# Patient Record
Sex: Female | Born: 1955 | Race: Black or African American | Hispanic: No | Smoking: Never smoker
Health system: Southern US, Community
[De-identification: ages and names within clinical notes are randomized; demographics above are authoritative.]

## PROBLEM LIST (undated history)

## (undated) DIAGNOSIS — IMO0001 Reserved for inherently not codable concepts without codable children: Secondary | ICD-10-CM

## (undated) DIAGNOSIS — R011 Cardiac murmur, unspecified: Secondary | ICD-10-CM

## (undated) DIAGNOSIS — J329 Chronic sinusitis, unspecified: Secondary | ICD-10-CM

## (undated) HISTORY — DX: Chronic sinusitis, unspecified: J32.9

## (undated) HISTORY — DX: Reserved for inherently not codable concepts without codable children: IMO0001

## (undated) HISTORY — DX: Cardiac murmur, unspecified: R01.1

## (undated) HISTORY — PX: TUBAL LIGATION: SHX77

---

## 2010-07-12 ENCOUNTER — Other Ambulatory Visit: Payer: Self-pay | Admitting: Obstetrics & Gynecology

## 2010-07-12 DIAGNOSIS — R1900 Intra-abdominal and pelvic swelling, mass and lump, unspecified site: Secondary | ICD-10-CM

## 2010-07-13 ENCOUNTER — Other Ambulatory Visit: Payer: Self-pay | Admitting: Obstetrics & Gynecology

## 2010-07-13 DIAGNOSIS — R1900 Intra-abdominal and pelvic swelling, mass and lump, unspecified site: Secondary | ICD-10-CM

## 2010-07-13 DIAGNOSIS — Z1231 Encounter for screening mammogram for malignant neoplasm of breast: Secondary | ICD-10-CM

## 2010-07-19 ENCOUNTER — Other Ambulatory Visit (HOSPITAL_COMMUNITY): Payer: Self-pay

## 2010-07-26 ENCOUNTER — Ambulatory Visit (HOSPITAL_COMMUNITY): Admission: RE | Admit: 2010-07-26 | Payer: BC Managed Care – PPO | Source: Ambulatory Visit

## 2010-07-26 ENCOUNTER — Ambulatory Visit (HOSPITAL_COMMUNITY): Payer: BC Managed Care – PPO

## 2010-12-28 ENCOUNTER — Ambulatory Visit: Payer: BC Managed Care – PPO | Admitting: Family Medicine

## 2011-01-20 ENCOUNTER — Ambulatory Visit (INDEPENDENT_AMBULATORY_CARE_PROVIDER_SITE_OTHER): Payer: BC Managed Care – PPO | Admitting: Family Medicine

## 2011-01-20 ENCOUNTER — Encounter: Payer: Self-pay | Admitting: Family Medicine

## 2011-01-20 VITALS — BP 139/78 | HR 97 | Temp 98.1°F | Ht 65.0 in | Wt 176.1 lb

## 2011-01-20 DIAGNOSIS — D259 Leiomyoma of uterus, unspecified: Secondary | ICD-10-CM

## 2011-01-20 DIAGNOSIS — Z0000 Encounter for general adult medical examination without abnormal findings: Secondary | ICD-10-CM

## 2011-01-20 DIAGNOSIS — Z1239 Encounter for other screening for malignant neoplasm of breast: Secondary | ICD-10-CM

## 2011-01-20 DIAGNOSIS — IMO0001 Reserved for inherently not codable concepts without codable children: Secondary | ICD-10-CM

## 2011-01-20 NOTE — Patient Instructions (Signed)
It was nice to meet you today!!  Please call and schedule your mammogram.   Please come back to see me in 6 months for a follow-up. At that time, we will do blood work and a Tetanus shot.  Thank you! Take care! Amber M. Hairford, M.D.

## 2011-01-23 ENCOUNTER — Encounter: Payer: Self-pay | Admitting: Family Medicine

## 2011-01-23 DIAGNOSIS — Z0000 Encounter for general adult medical examination without abnormal findings: Secondary | ICD-10-CM | POA: Insufficient documentation

## 2011-01-23 DIAGNOSIS — IMO0001 Reserved for inherently not codable concepts without codable children: Secondary | ICD-10-CM | POA: Insufficient documentation

## 2011-01-24 NOTE — Progress Notes (Signed)
  Subjective:    Patient ID: Jaclyn Barrera, female    DOB: November 26, 1955, 55 y.o.   MRN: [id number]  HPI  Pt is a new patient, coming to the office to establish care with a PCP after being seen at [facility] in June for vaginal bleeding. Pt has no concerns today.   She was seen at [facility] and diagnosed with bleeding uterine fibroids. She started taking Alfalfa and Vitamin A for this, which she states has helped. We do not have these records to see which labs and studies she had done, so we will request these records. Pt still has heavy menstrual bleeding and cramping that she states has been heavy for a few years. LMP on 01/14/2011  Otherwise, she has no past medical history other than frequent sinus problems which she treats with OTC supplements and green tea.  Unsure of pt's last pap smear. Never had mammogram or colonoscopy. Does not take Flu Shot. Last Tetanus shot was in the [employer], but unsure when. She goes to dentist and eye doctor.    Review of Systems  Constitutional: Negative for fever, chills and activity change.  HENT: Negative for congestion and rhinorrhea.   Eyes: Negative for visual disturbance.  Respiratory: Negative for cough and shortness of breath.   Cardiovascular: Negative for chest pain and leg swelling.  Gastrointestinal: Negative for nausea, vomiting, diarrhea and constipation.  Genitourinary: Negative for difficulty urinating.  Musculoskeletal: Negative for myalgias and arthralgias.  Skin: Positive for rash.  Neurological: Negative for headaches.  Hematological: Negative for adenopathy.       Objective:   Physical Exam  Constitutional: She is oriented to person, place, and time. She appears well-developed and well-nourished. No distress.  HENT:  Head: Normocephalic and atraumatic.  Right Ear: External ear normal.  Left Ear: External ear normal.  Eyes: Conjunctivae are normal. Pupils are equal, round, and reactive to light.  Neck: Normal range of  motion. No tracheal deviation present. No thyromegaly present.  Cardiovascular: Normal rate, regular rhythm and normal heart sounds.   Pulmonary/Chest: Effort normal and breath sounds normal.  Abdominal: Soft. Bowel sounds are normal. She exhibits no distension. There is no tenderness.  Musculoskeletal: Normal range of motion. She exhibits edema (Trace).  Neurological: She is alert and oriented to person, place, and time. No cranial nerve deficit.  Skin: Skin is warm and dry. Rash (Hypopigmented on forearms.) noted.  Psychiatric: She has a normal mood and affect. Her behavior is normal.          Assessment & Plan:

## 2011-01-24 NOTE — Assessment & Plan Note (Signed)
Patient has heavy periods and was seen at [facility], dx with bleeding uterine fibroids. Will get those records and see patient back in 6 months. At that time, we will do a pelvic exam to evaluate for masses, etc. Pt aware of this plan.

## 2011-01-24 NOTE — Assessment & Plan Note (Signed)
Patient has never had mammogram or colonoscopy. She has a mammo previously ordered in our system. She was given the number to call and schedule her mammogram soon. Since she was a new patient and I did not want to overwhelm her with tests/recommendations, I did mention a colonoscopy but did not order it today. She denied the flu shot. She also needs a tetanus shot, but she does not want to get that today since she was not prepared for it. She states she will get it at her next visit in 6 months. Did not order labs today since she was recently seen at [facility]. We will request those records and see what she needs, then order labs at next visit, if necessary.

## 2011-10-13 ENCOUNTER — Ambulatory Visit (INDEPENDENT_AMBULATORY_CARE_PROVIDER_SITE_OTHER): Payer: BC Managed Care – PPO | Admitting: Family Medicine

## 2011-10-13 ENCOUNTER — Encounter: Payer: Self-pay | Admitting: Family Medicine

## 2011-10-13 VITALS — BP 120/79 | HR 76 | Temp 98.0°F | Ht 65.0 in | Wt 178.0 lb

## 2011-10-13 DIAGNOSIS — Z0000 Encounter for general adult medical examination without abnormal findings: Secondary | ICD-10-CM

## 2011-10-13 DIAGNOSIS — D259 Leiomyoma of uterus, unspecified: Secondary | ICD-10-CM

## 2011-10-13 DIAGNOSIS — Z23 Encounter for immunization: Secondary | ICD-10-CM

## 2011-10-13 DIAGNOSIS — IMO0001 Reserved for inherently not codable concepts without codable children: Secondary | ICD-10-CM

## 2011-10-13 NOTE — Patient Instructions (Signed)
It was great to see you today! I am so glad everything is going well. If you need me to fill out any forms for your foster care class, please let me know.  Come back to see me in about 6 months, or sooner if you need anything!  Alysson Geist M. Khalea Ventura, M.D.

## 2011-10-13 NOTE — Progress Notes (Signed)
Subjective:     Patient ID: Jaclyn Barrera, female   DOB: 1956/03/24, 56 y.o.   MRN: [id number]  HPI Patient is a 56 yo F here for a follow up appointment.  1. Menorrhagia- Much improved. Patient states she feels like she is going through menopause. Her bleeding symptoms we had discussed at her last visit have improved. She has bleeding some but not to the extent she did a few months ago. No pain related to periods. No excessive bleeding or passing clots. No concerns about this today.  2. Health Maintenance- Needs tetanus shot today. Also needs mammogram. Patient had pap last year at [facility], which she states was wnl although those records were never sent. Patient very healthy overall. No chronic medications. No complaints. She had some intermittent dizziness last week related to her sinuses, but this has improved. No headaches, congestion, sore throat or current dizziness. Patient also states she may need a form completed for her foster care class, which I will be happy to complete when she needs it.  History reviewed: Non-smoker.    Review of Systems See HPI above    Objective:   Physical Exam  Constitutional: She appears well-developed and well-nourished. No distress.  HENT:  Head: Normocephalic and atraumatic.  Cardiovascular: Normal rate, regular rhythm and normal heart sounds.   No murmur heard. Pulmonary/Chest: Effort normal and breath sounds normal.  Abdominal: Soft. She exhibits no mass. There is no tenderness.  Musculoskeletal: Normal range of motion. She exhibits no edema.   Assessment:     56 yo healthy female follow-up appointment    Plan:

## 2011-10-13 NOTE — Assessment & Plan Note (Signed)
Bleeding has improved. Patient has no complaints at this time. If she continues to have bleeding, will consider endometrial biopsy and/or referral to gyn. For now, we will continue to monitor. If she has large bleeding episodes again, can give Provera another shot and will also likely need to check CBC.

## 2011-10-13 NOTE — Assessment & Plan Note (Signed)
Patient received Tdap today. Mammogram was ordered and she will schedule it. If she needs paperwork for her foster parent class, she should bring it to the office and I will be happy to complete it.

## 2011-10-27 ENCOUNTER — Encounter: Payer: Self-pay | Admitting: Family Medicine

## 2011-10-27 ENCOUNTER — Ambulatory Visit
Admission: RE | Admit: 2011-10-27 | Discharge: 2011-10-27 | Disposition: A | Discharge status: Home / Self Care | Payer: BC Managed Care – PPO | Source: Ambulatory Visit | Attending: Family Medicine | Admitting: Family Medicine

## 2011-10-27 DIAGNOSIS — Z0000 Encounter for general adult medical examination without abnormal findings: Secondary | ICD-10-CM

## 2011-11-01 ENCOUNTER — Telehealth: Payer: Self-pay | Admitting: Family Medicine

## 2011-11-01 NOTE — Telephone Encounter (Signed)
Patient dropped off form to be filled out for social services.  Please call her when it is ready to be picked up.

## 2011-11-02 NOTE — Telephone Encounter (Signed)
Medical Evaluation Form from [place] [employer] placed in Dr. Algis Downs box for completion.  Jaclyn Barrera

## 2011-11-09 NOTE — Telephone Encounter (Signed)
Message left on voicemail to call regarding form. Need to schedule PPD.

## 2011-11-09 NOTE — Telephone Encounter (Signed)
Form completed. Asks for date of last PPD, and we do not have one recorded in our clinic notes. Could you please let patient know the form has been signed, but she will need to have a TB test before it is "completed"  Thank you! Jurline Folger M. Sameka Bagent, M.D.

## 2011-11-10 NOTE — Telephone Encounter (Signed)
Appointment scheduled for PPD placement 08/05.

## 2011-11-13 ENCOUNTER — Ambulatory Visit (INDEPENDENT_AMBULATORY_CARE_PROVIDER_SITE_OTHER): Payer: BC Managed Care – PPO | Admitting: *Deleted

## 2011-11-13 DIAGNOSIS — Z111 Encounter for screening for respiratory tuberculosis: Secondary | ICD-10-CM

## 2011-11-15 ENCOUNTER — Ambulatory Visit (INDEPENDENT_AMBULATORY_CARE_PROVIDER_SITE_OTHER): Payer: BC Managed Care – PPO | Admitting: *Deleted

## 2011-11-15 DIAGNOSIS — Z111 Encounter for screening for respiratory tuberculosis: Secondary | ICD-10-CM

## 2011-11-15 DIAGNOSIS — IMO0001 Reserved for inherently not codable concepts without codable children: Secondary | ICD-10-CM

## 2011-11-15 NOTE — Progress Notes (Signed)
Physical form give to patient.

## 2013-02-25 ENCOUNTER — Encounter: Payer: Self-pay | Admitting: Family Medicine

## 2013-02-25 ENCOUNTER — Other Ambulatory Visit (HOSPITAL_COMMUNITY)
Admission: RE | Admit: 2013-02-25 | Discharge: 2013-02-25 | Disposition: A | Discharge status: Home / Self Care | Payer: BC Managed Care – PPO | Source: Ambulatory Visit | Attending: Family Medicine | Admitting: Family Medicine

## 2013-02-25 ENCOUNTER — Ambulatory Visit (INDEPENDENT_AMBULATORY_CARE_PROVIDER_SITE_OTHER): Payer: BC Managed Care – PPO | Admitting: Family Medicine

## 2013-02-25 VITALS — BP 154/92 | HR 80 | Temp 98.5°F | Ht 65.0 in | Wt 182.0 lb

## 2013-02-25 DIAGNOSIS — Z124 Encounter for screening for malignant neoplasm of cervix: Secondary | ICD-10-CM

## 2013-02-25 DIAGNOSIS — Z01419 Encounter for gynecological examination (general) (routine) without abnormal findings: Secondary | ICD-10-CM | POA: Insufficient documentation

## 2013-02-25 DIAGNOSIS — Z1151 Encounter for screening for human papillomavirus (HPV): Secondary | ICD-10-CM | POA: Insufficient documentation

## 2013-02-25 DIAGNOSIS — N898 Other specified noninflammatory disorders of vagina: Secondary | ICD-10-CM

## 2013-02-25 DIAGNOSIS — Z0000 Encounter for general adult medical examination without abnormal findings: Secondary | ICD-10-CM

## 2013-02-25 DIAGNOSIS — N760 Acute vaginitis: Secondary | ICD-10-CM

## 2013-02-25 LAB — POCT WET PREP (WET MOUNT): Clue Cells Wet Prep Whiff POC: NEGATIVE

## 2013-02-25 MED ORDER — METRONIDAZOLE 500 MG PO TABS: 500.0000 mg | ORAL_TABLET | Freq: Two times a day (BID) | ORAL | Status: DC

## 2013-02-25 NOTE — Patient Instructions (Signed)
Bacterial Vaginosis Bacterial vaginosis is an infection of the vagina. A healthy vagina has many kinds of good germs (bacteria). Sometimes the number of good germs can change. This allows bad germs to move in and cause an infection. You may be given medicine (antibiotics) to treat the infection. Or, you may not need treatment at all. HOME CARE  Take your medicine as told. Finish them even if you start to feel better.  Do not have sex until you finish your medicine.  Do not douche.  Practice safe sex.  Tell your sex partner that you have an infection. They should see their doctor for treatment if they have problems. GET HELP RIGHT AWAY IF:  You do not get better after 3 days of treatment.  You have grey fluid (discharge) coming from your vagina.  You have pain.  You have a temperature of 102 F (38.9 C) or higher. MAKE SURE YOU:   Understand these instructions.  Will watch your condition.  Will get help right away if you are not doing well or get worse. Document Released: 01/04/2008 Document Revised: 06/19/2011 Document Reviewed: 11/06/2012 [employer] Patient Information 2014 [employer].  

## 2013-02-25 NOTE — Progress Notes (Signed)
Patient ID: Jaclyn Barrera, female   DOB: 1956-03-12, 57 y.o.   MRN: [id number]  [facility] Jaclyn Barrera M. Jaclyn Kirkey, MD Phone: [phone]   Subjective: HPI: Patient is a 57 y.o. female presenting to clinic today for annual exam. Concerns today include vaginal discharge  Vaginitis Patient presents for evaluation of an abnormal vaginal discharge. Symptoms have been present for several months. Vaginal symptoms: discharge described as clear and odorless. Contraception: none. She denies bumps, local irritation and odor. Sexually transmitted infection risk: very low risk of STD exposure. Menstrual flow: currently not having menstrual period; perimenopausal.  Health maintenance Last pap smear - unsure, will collect today. Last mammogram >1 year. Colonoscopy - Never had Flu shot - Declined No signs of depression  History Reviewed: Never smoker.  ROS: Please see HPI above.  Objective: Office vital signs reviewed. BP 154/92  Pulse 80  Temp(Src) 98.5 F (36.9 C) (Oral)  Ht 5\' 5"  (1.651 m)  Wt 182 lb (82.555 kg)  BMI 30.29 kg/m2  LMP 12/09/2012  Physical Examination:  General: Awake, alert. NAD HEENT: Atraumatic, normocephalic Neck: No masses palpated. No LAD Pulm: CTAB, no wheezes Cardio: RRR, no murmurs appreciated Abdomen:+BS, soft, nontender, nondistended GU: Difficult to fully examine cervix due to posterior position. However, os visualized and pap collected. Wet prep collected. Extremities: No edema Neuro: Grossly intact  Assessment: 57 y.o. female annual exam  Plan: See Problem List and After Visit Summary

## 2013-02-26 DIAGNOSIS — N760 Acute vaginitis: Secondary | ICD-10-CM | POA: Insufficient documentation

## 2013-02-26 NOTE — Assessment & Plan Note (Signed)
Pap collected. Given number for mammogram and colonoscopy. No labs today since not on medication, but consider at next visit.

## 2013-02-26 NOTE — Assessment & Plan Note (Signed)
Wet prep collected, showed clue cells which was not expected. It appears to be physiologic discharge, but given her concern and clues on microscopy, will treat at BV with Flagyl x7 days. Patient agrees. F/u as needed.

## 2013-02-28 ENCOUNTER — Encounter: Payer: Self-pay | Admitting: Family Medicine

## 2014-02-09 ENCOUNTER — Encounter: Payer: Self-pay | Admitting: Family Medicine

## 2014-03-26 ENCOUNTER — Telehealth: Payer: Self-pay | Admitting: Obstetrics and Gynecology

## 2014-03-26 ENCOUNTER — Ambulatory Visit (INDEPENDENT_AMBULATORY_CARE_PROVIDER_SITE_OTHER): Payer: BC Managed Care – PPO | Admitting: Obstetrics and Gynecology

## 2014-03-26 ENCOUNTER — Encounter: Payer: Self-pay | Admitting: Obstetrics and Gynecology

## 2014-03-26 VITALS — BP 124/78 | HR 82 | Temp 98.0°F | Ht 65.0 in | Wt 178.0 lb

## 2014-03-26 DIAGNOSIS — D259 Leiomyoma of uterus, unspecified: Secondary | ICD-10-CM | POA: Diagnosis not present

## 2014-03-26 LAB — CBC
HCT: 39.3 % (ref 36.0–46.0)
HEMOGLOBIN: 13.4 g/dL (ref 12.0–15.0)
MCH: 28.2 pg (ref 26.0–34.0)
MCHC: 34.1 g/dL (ref 30.0–36.0)
MCV: 82.7 fL (ref 78.0–100.0)
MPV: 9.6 fL (ref 9.4–12.4)
Platelets: 324 10*3/uL (ref 150–400)
RBC: 4.75 MIL/uL (ref 3.87–5.11)
RDW: 13.7 % (ref 11.5–15.5)
WBC: 4.3 10*3/uL (ref 4.0–10.5)

## 2014-03-26 NOTE — Progress Notes (Signed)
     Subjective: Chief Complaint  Patient presents with  . Annual Exam    HPI: Jaclyn Barrera is a 58 y.o. presenting to clinic today for her annual exam. She has no concerns today. I discussed with her if she was still having menstraul periods. Patient states that she is still having menstrual bleeding. She says that they are irregular. Says it is more like spotting. She has never fully stopped having periods for greater than 1 year. No diagnosis of menopause in her history. However patient does have history of large fibroid.  Health Maintenance: Colonoscopy and mammogram. Flu vaccine.  Nonsmoker  All systems were reviewed and were negative unless otherwise noted in the HPI Past Medical, Surgical, Social, and Family History Reviewed & Updated per EMR.   Objective: BP 124/78 mmHg  Pulse 82  Temp(Src) 98 F (36.7 C) (Oral)  Ht 5\' 5"  (1.651 m)  Wt 80.74 kg (178 lb)  BMI 29.62 kg/m2  Physical Exam  Constitutional: She is oriented to person, place, and time and well-developed, well-nourished, and in no distress.  Eyes: Conjunctivae and EOM are normal. Pupils are equal, round, and reactive to light. No scleral icterus.  Neck: Normal range of motion. Neck supple.  Cardiovascular: Normal rate and regular rhythm.   Murmur heard. Pulmonary/Chest: Effort normal and breath sounds normal.  Abdominal: Soft. Bowel sounds are normal.  Large fibroid measuring about 18cm in height, firm.  Musculoskeletal: Normal range of motion. She exhibits no edema or tenderness.  Neurological: She is alert and oriented to person, place, and time. She has normal reflexes. Gait normal.  Skin: Skin is warm and dry. No rash noted.    Assessment/Plan: Please see problem based Assessment and Plan  Health Maintainance: Patient given numbers to schedule colonoscopy and mammography. Patient declined flu vaccine.  Luiz Blare, DO 03/26/2014, 1:48 PM PGY-1, [place]

## 2014-03-26 NOTE — Patient Instructions (Addendum)
Ms. Jaclyn Barrera it was great to see you today!  I am pleased to hear that things are going well for you.  Here are some of the things we discussed today: -Please schedule your colonoscopy and mammogram. These are important screening test for your health.  -We are ordering an ultrasound to look at your fibroid. I will follow-up with the results. -We need to do an endometrial biopsy as well.    Please schedule a follow-up appointment for procedure clinic to have an endometrial biopsy.   Thanks for allowing me to be a part of your care! Dr. Gerarda Fraction   Fibroids Fibroids are lumps (tumors) that can occur any place in a woman's body. These lumps are not cancerous. Fibroids vary in size, weight, and where they grow. HOME CARE  Do not take aspirin.  Write down the number of pads or tampons you use during your period. Tell your doctor. This can help determine the best treatment for you. GET HELP RIGHT AWAY IF:  You have pain in your lower belly (abdomen) that is not helped with medicine.  You have cramps that are not helped with medicine.  You have more bleeding between or during your period.  You feel lightheaded or pass out (faint).  Your lower belly pain gets worse. MAKE SURE YOU:  Understand these instructions.  Will watch your condition.  Will get help right away if you are not doing well or get worse. Document Released: 04/29/2010 Document Revised: 06/19/2011 Document Reviewed: 04/29/2010 [facility] Patient Information 2015 [place], [place]. This information is not intended to replace advice given to you by your health care provider. Make sure you discuss any questions you have with your health care provider.  Endometrial Biopsy Endometrial biopsy is a procedure in which a tissue sample is taken from inside the uterus. The tissue sample is then looked at under a microscope to see if the tissue is normal or abnormal. The endometrium is the lining of the uterus. This procedure helps  determine where you are in your menstrual cycle and how hormone levels are affecting the lining of the uterus. This procedure may also be used to evaluate uterine bleeding or to diagnose endometrial cancer, tuberculosis, polyps, or inflammatory conditions.  LET [facility] CARE PROVIDER KNOW ABOUT:  Any allergies you have.  All medicines you are taking, including vitamins, herbs, eye drops, creams, and over-the-counter medicines.  Previous problems you or members of your family have had with the use of anesthetics.  Any blood disorders you have.  Previous surgeries you have had.  Medical conditions you have.  Possibility of pregnancy. RISKS AND COMPLICATIONS Generally, this is a safe procedure. However, as with any procedure, complications can occur. Possible complications include:  Bleeding.  Pelvic infection.  Puncture of the uterine wall with the biopsy device (rare). BEFORE THE PROCEDURE   Keep a record of your menstrual cycles as directed by your health care provider. You may need to schedule your procedure for a specific time in your cycle.  You may want to bring a sanitary pad to wear home after the procedure.  Arrange for someone to drive you home after the procedure if you will be given a medicine to help you relax (sedative). PROCEDURE   You may be given a sedative to relax you.  You will lie on an exam table with your feet and legs supported as in a pelvic exam.  Your health care provider will insert an instrument (speculum) into your vagina to see your  cervix.  Your cervix will be cleansed with an antiseptic solution. A medicine (local anesthetic) will be used to numb the cervix.  A forceps instrument (tenaculum) will be used to hold your cervix steady for the biopsy.  A thin, rodlike instrument (uterine sound) will be inserted through your cervix to determine the length of your uterus and the location where the biopsy sample will be removed.  A thin,  flexible tube (catheter) will be inserted through your cervix and into the uterus. The catheter is used to collect the biopsy sample from your endometrial tissue.  The catheter and speculum will then be removed, and the tissue sample will be sent to a lab for examination. AFTER THE PROCEDURE  You will rest in a recovery area until you are ready to go home.  You may have mild cramping and a small amount of vaginal bleeding for a few days after the procedure. This is normal.  Make sure you find out how to get your test results. Document Released: 07/28/2004 Document Revised: 11/27/2012 Document Reviewed: 09/11/2012 [facility] Patient Information 2015 [place], [place]. This information is not intended to replace advice given to you by your health care provider. Make sure you discuss any questions you have with your health care provider.

## 2014-03-26 NOTE — Telephone Encounter (Signed)
When got home, read about the supplement black seed oil. Found out one of the side effects is having her menstrual come on

## 2014-03-26 NOTE — Assessment & Plan Note (Addendum)
A: Patient with history of bleeding fibroids. She has never had endometrial biopsy or ultrasound of fibroid. Measures like a 18 week uterus. Patient unconcerned about bleeding. However it is concerning that a 58 year old woman is still having menstrual periods. At this time diagnosis concerning for endometrial cancer until proven otherwise. P: Ordered transvaginal and pelvic ultrasounds. CBC unremarkable for anemia. Patient to schedule appointment for endometrial biopsy in procedure clinic. Will likely need gyn referral.

## 2014-03-31 ENCOUNTER — Ambulatory Visit (HOSPITAL_COMMUNITY): Payer: BC Managed Care – PPO

## 2014-04-16 ENCOUNTER — Ambulatory Visit (INDEPENDENT_AMBULATORY_CARE_PROVIDER_SITE_OTHER): Payer: BC Managed Care – PPO | Admitting: Family Medicine

## 2014-04-16 VITALS — BP 142/86 | HR 89 | Temp 98.1°F | Ht 65.0 in | Wt 181.3 lb

## 2014-04-16 DIAGNOSIS — N938 Other specified abnormal uterine and vaginal bleeding: Secondary | ICD-10-CM

## 2014-04-16 NOTE — Progress Notes (Signed)
Patient ID: Jaclyn Barrera, female   DOB: [birth year], 59 y.o.   MRN: [id number] 59 year old female who is never stopped having vaginal bleeding. Continues to have irregular menses. Has been previously told she had some fibroids. Does not have pelvic pain. Recent Pap smear was normal. here for further evaluation of dysfunctional uterine bleeding.  PROCEDURE NOTE: Endometrial Biopsy Patient given informed consent, signed copy in the chart.  Appropriate time out taken. . The patient was placed in the lithotomy position and the cervix brought into view with sterile speculum.  The portio of cervix was cleansed x 3 with betadine swabs.  A tenaculum was placed in the anterior lip of the cervix.  A uterine sound was used to measure the uterus.. A pipelle was introduced  into the uterus, suction created,  and an endometrial sample was obtained. All equipment was removed and accounted for.   The patient tolerated the procedure well.  Minimal spotting type bleeding.  Patient given post procedure instructions. I will notify her of  pathology results. She would also benefit from having the pelvic ultrasound which is 30 been ordered. She has not gone for the test yet but says she will. She also reports taking over-the-counter black seed oil for many months and some type of nutritional supplement. She stopped that recently thinking it might be contributing to her bleeding.  Ok to leave message on her primary cell phone number listed in chart

## 2014-04-21 ENCOUNTER — Encounter: Payer: Self-pay | Admitting: Family Medicine

## 2014-04-21 DIAGNOSIS — N939 Abnormal uterine and vaginal bleeding, unspecified: Secondary | ICD-10-CM | POA: Insufficient documentation

## 2014-05-11 ENCOUNTER — Encounter: Payer: Self-pay | Admitting: Obstetrics and Gynecology

## 2014-11-13 ENCOUNTER — Other Ambulatory Visit: Payer: Self-pay

## 2014-11-13 ENCOUNTER — Telehealth: Payer: Self-pay | Admitting: Obstetrics and Gynecology

## 2014-11-13 DIAGNOSIS — Z1231 Encounter for screening mammogram for malignant neoplasm of breast: Secondary | ICD-10-CM

## 2014-11-13 NOTE — Telephone Encounter (Signed)
Patient is due for her first colonoscopy and needs information mailed to her.  She has [employer] and doesn't need a referral. Nene Aranas,CMA

## 2014-11-13 NOTE — Telephone Encounter (Signed)
Pt calling and is asking where her appointment is set up for a colonoscopy, I didn't see anything in her chart. If she isn't scheduled for one, she states that she does need to make an appt for it. Jaclyn Barrera, ASA

## 2014-11-19 ENCOUNTER — Ambulatory Visit
Admission: RE | Admit: 2014-11-19 | Discharge: 2014-11-19 | Disposition: A | Discharge status: Home / Self Care | Payer: BC Managed Care – PPO | Source: Ambulatory Visit

## 2014-11-19 DIAGNOSIS — Z1231 Encounter for screening mammogram for malignant neoplasm of breast: Secondary | ICD-10-CM

## 2016-04-06 ENCOUNTER — Encounter: Payer: Self-pay | Admitting: Obstetrics and Gynecology

## 2016-04-06 ENCOUNTER — Ambulatory Visit (INDEPENDENT_AMBULATORY_CARE_PROVIDER_SITE_OTHER): Payer: BC Managed Care – PPO | Admitting: Obstetrics and Gynecology

## 2016-04-06 VITALS — BP 114/68 | HR 86 | Temp 97.6°F | Wt 181.0 lb

## 2016-04-06 DIAGNOSIS — Z114 Encounter for screening for human immunodeficiency virus [HIV]: Secondary | ICD-10-CM | POA: Diagnosis not present

## 2016-04-06 DIAGNOSIS — Z0000 Encounter for general adult medical examination without abnormal findings: Secondary | ICD-10-CM

## 2016-04-06 DIAGNOSIS — Z1159 Encounter for screening for other viral diseases: Secondary | ICD-10-CM

## 2016-04-06 LAB — CBC
HEMATOCRIT: 40 % (ref 35.0–45.0)
Hemoglobin: 13 g/dL (ref 11.7–15.5)
MCH: 28.7 pg (ref 27.0–33.0)
MCHC: 32.5 g/dL (ref 32.0–36.0)
MCV: 88.3 fL (ref 80.0–100.0)
MPV: 9.5 fL (ref 7.5–12.5)
PLATELETS: 267 10*3/uL (ref 140–400)
RBC: 4.53 MIL/uL (ref 3.80–5.10)
RDW: 14.5 % (ref 11.0–15.0)
WBC: 3.8 10*3/uL (ref 3.8–10.8)

## 2016-04-06 LAB — BASIC METABOLIC PANEL WITH GFR
BUN: 15 mg/dL (ref 7–25)
CALCIUM: 9.5 mg/dL (ref 8.6–10.4)
CO2: 28 mmol/L (ref 20–31)
Chloride: 106 mmol/L (ref 98–110)
Creat: 1.13 mg/dL — ABNORMAL HIGH (ref 0.50–0.99)
GFR, EST AFRICAN AMERICAN: 61 mL/min (ref >=60)
GFR, EST NON AFRICAN AMERICAN: 53 mL/min — AB (ref >=60)
Glucose, Bld: 71 mg/dL (ref 65–99)
POTASSIUM: 4 mmol/L (ref 3.5–5.3)
SODIUM: 142 mmol/L (ref 135–146)

## 2016-04-06 NOTE — Assessment & Plan Note (Signed)
Healthy 60yo female. No significant PMH. Declined flu vaccine today. Blood work collected to include hep C, HIV, BMP, CBC. Next pap due in 2019.

## 2016-04-06 NOTE — Progress Notes (Signed)
Jaclyn Barrera is a 60 y.o.  female presents for well woman/preventative visit.  Acute Concerns: None  Diet: eats everything but pork, well-balanced, cooks most meals, takes multivitamin  Exercise: Yes, walking/lifting weights  Sexual History: No  Birth history: G5P5  LMP: No LMP recorded. Patient is not currently having periods (Reason: Perimenopausal).  POA/Living Will: No living will but wants information.  Would want daughters to make decisions for her  Social:  Social History   Social History  . Marital status: Divorced    Spouse name: N/A  . Number of children: N/A  . Years of education: N/A   Occupational History  . 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 Smoking status: Never Smoker  . Smokeless tobacco: Never Used  . Alcohol use Not on file  . Drug use: Unknown  . Sexual activity: Not on file   Other Topics Concern  . Not on file   Social History Narrative   October 2012: Divorced. Not currently sexually active. 5 children: 3 boys (ages 65, 69, 63) and 2 girls (ages 55, 12). Lives at home with 39 yo son. No religious preferences. Eats a balanced diet. Does exercise (walks).     Health Maintenance Due  Topic Date Due  . Hepatitis C Screening  08-05-1955  . HIV Screening  08/02/1970  . COLONOSCOPY  08/01/2005  . ZOSTAVAX  08/02/2015  . INFLUENZA VACCINE  11/09/2015  . PAP SMEAR  02/26/2016    ROS reviewed and were negative unless otherwise noted in HPI.   Physical Exam: BP 114/68   Pulse 86   Temp 97.6 F (36.4 C) (Oral)   Wt 181 lb (82.1 kg)   SpO2 99%   BMI 30.12 kg/m   Physical Exam  Constitutional: She is oriented to person, place, and time. She appears well-developed and well-nourished. No distress.  HENT:  Head: Normocephalic and atraumatic.  Right Ear: External ear normal.  Left Ear: External ear normal.  Mouth/Throat: Oropharynx is clear and moist.   Eyes: Conjunctivae and EOM are normal. Pupils are equal, round, and reactive to light.  Neck: Normal range of motion. Neck supple. No thyromegaly present.  Cardiovascular: Normal rate, regular rhythm, normal heart sounds and intact distal pulses.   Pulmonary/Chest: Effort normal and breath sounds normal.  Abdominal: Soft. Bowel sounds are normal. She exhibits no distension. There is no tenderness. There is no guarding.  Musculoskeletal: Normal range of motion. She exhibits no edema or tenderness.  Neurological: She is alert and oriented to person, place, and time. She has normal strength. No cranial nerve deficit.  Skin: Skin is warm and dry. No rash noted.  Psychiatric: She has a normal mood and affect.    ASSESSMENT & PLAN: 60 y.o. female presents for annual well woman/preventative exam. Please see problem specific assessment and plan.     Luiz Blare, DO 04/06/2016, 7:06 AM PGY-3, [place]

## 2016-04-06 NOTE — Patient Instructions (Signed)

## 2016-04-07 LAB — HIV ANTIBODY (ROUTINE TESTING W REFLEX): HIV: NONREACTIVE

## 2016-04-07 LAB — HEPATITIS C ANTIBODY: HCV AB: NEGATIVE

## 2016-09-11 ENCOUNTER — Ambulatory Visit: Payer: BC Managed Care – PPO | Admitting: Family Medicine

## 2016-09-12 ENCOUNTER — Encounter: Payer: Self-pay | Admitting: Internal Medicine

## 2016-09-12 ENCOUNTER — Ambulatory Visit (INDEPENDENT_AMBULATORY_CARE_PROVIDER_SITE_OTHER): Payer: BC Managed Care – PPO | Admitting: Internal Medicine

## 2016-09-12 VITALS — BP 130/92 | HR 75 | Temp 98.1°F | Wt 179.0 lb

## 2016-09-12 DIAGNOSIS — M1611 Unilateral primary osteoarthritis, right hip: Secondary | ICD-10-CM | POA: Diagnosis not present

## 2016-09-12 NOTE — Assessment & Plan Note (Signed)
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

## 2016-09-12 NOTE — Patient Instructions (Signed)

## 2016-09-12 NOTE — Progress Notes (Signed)
   [facility] Kerrin Mo, MD Phone: [phone]  Reason For Visit: Right Hip Pain   # 2 weeks of hip pain, not associated with trauma. Radiates down right leg to mid thigh. No inflammation. Hip pain is worse in the morning when patient first gets up and improves as patient walks around. No fevers no chills. No history of hip pain however does have a history of knee pain associated with osteoarthritis. Has not been taking any medications for this states that she prefers not to take any medications. Would like some exercises to help with her pain.   Past Medical History Reviewed problem list.  Medications- reviewed and updated No additions to family history Social history- patient is a non smoker  Objective: BP (!) 130/92   Pulse 75   Temp 98.1 F (36.7 C) (Oral)   Wt 179 lb (81.2 kg)   BMI 29.79 kg/m  Gen: NAD, alert, cooperative with exam Extremities: No abnormalities noted on inspection of the hips bilaterally, No pain with palpation along the trochanter, no pain with range of motion, normal range of motion and hip with internal/external rotation, no signs of limited range of motion, DP pulses intact, no swelling in lower swelling  bilaterally   Assessment/Plan: See problem based a/p  Osteoarthritis of right hip Most concerning for hip osteoarthritis, though possibly piriformis syndrome as well -Provided exercises to patient -Ibuprofen or Tylenol as needed for pain -Follow up in 6 weeks if no improvement -At that point in time could consider hip x-rays and/ or physical therapy.

## 2017-07-17 ENCOUNTER — Encounter: Payer: BC Managed Care – PPO | Admitting: Family Medicine

## 2017-08-07 ENCOUNTER — Other Ambulatory Visit (HOSPITAL_COMMUNITY)
Admission: RE | Admit: 2017-08-07 | Discharge: 2017-08-07 | Disposition: A | Discharge status: Home / Self Care | Payer: BC Managed Care – PPO | Source: Ambulatory Visit | Attending: Family Medicine | Admitting: Family Medicine

## 2017-08-07 ENCOUNTER — Other Ambulatory Visit: Payer: Self-pay

## 2017-08-07 ENCOUNTER — Ambulatory Visit (INDEPENDENT_AMBULATORY_CARE_PROVIDER_SITE_OTHER): Payer: BC Managed Care – PPO | Admitting: Family Medicine

## 2017-08-07 ENCOUNTER — Encounter: Payer: Self-pay | Admitting: Family Medicine

## 2017-08-07 VITALS — BP 126/84 | HR 80 | Temp 98.2°F | Wt 176.0 lb

## 2017-08-07 DIAGNOSIS — Z1239 Encounter for other screening for malignant neoplasm of breast: Secondary | ICD-10-CM

## 2017-08-07 DIAGNOSIS — Z1211 Encounter for screening for malignant neoplasm of colon: Secondary | ICD-10-CM

## 2017-08-07 DIAGNOSIS — Z01419 Encounter for gynecological examination (general) (routine) without abnormal findings: Secondary | ICD-10-CM

## 2017-08-07 DIAGNOSIS — Z124 Encounter for screening for malignant neoplasm of cervix: Secondary | ICD-10-CM | POA: Diagnosis not present

## 2017-08-07 NOTE — Progress Notes (Signed)
   Subjective:   Tempie Gibeault is a 62 y.o. female with a history of OA, uterine fibroid here for well woman/preventative visit and annual GYN examination.  Acute Concerns: none   Diet: well balanced, cut out white sugar. Dairy and eggs increase mucus production, has done well since cut out.  Exercise: walks on tues/thurs, starting to lift weights. Using exercises for hips and knees.  Sexual/Birth History: G5P5, not currently sexually active with nion partner - last period last year. No other vaginal bleeding.  Birth Control: none  POA/Living Will: not yet  Immunization:  Tdap/TD: received 2013, due 2023.  Influenza: not in season  Cancer Screening:  Pap Smear: normal, 02/2013  Mammogram: normal, 11/2014, no FH of breast cancer  Colonoscopy: due  Dexa: none  Review of Systems:  Per HPI.   [place]: reviewed. Smoking status reviewed. Medications reviewed.  Objective:   BP 126/84   Pulse 80   Temp 98.2 F (36.8 C) (Oral)   Wt 176 lb (79.8 kg)   SpO2 98%   BMI 29.29 kg/m  Vitals and nursing note reviewed.  General: well nourished, well developed, in no acute distress with non-toxic appearance HEENT: normocephalic, atraumatic, moist mucous membranes CV: regular rate and rhythm without murmurs, rubs, or gallops, no lower extremity edema Lungs: clear to auscultation bilaterally with normal work of breathing Abdomen: soft, non-tender, non-distended, no masses or organomegaly palpable, normoactive bowel sounds GYN:  External genitalia within normal limits.  Vaginal mucosa pink, moist, normal rugae.  Nonfriable cervix without lesions, no discharge or bleeding noted on speculum exam.  Bimanual exam revealed normal, nongravid uterus.  No cervical motion tenderness. No adnexal masses bilaterally.   Skin: warm, dry, no rashes or lesions Extremities: warm and well perfused, normal tone MSK: ROM grossly intact, strength intact, gait normal Neuro: Alert and oriented, speech  normal  Assessment & Plan:   Well Woman exam [place] reviewed. Performed Pap Smear today, will call with results. UTD on immunizations. Elected for [employer] test rather than colonoscopy. Exam within normal limits. Recommended supplementing with Calcium given limited dairy intake and menopausal status. Follow up in one year.  No problem-specific Assessment & Plan notes found for this encounter.  Orders Placed This Encounter  Procedures  . Fecal occult blood, imunochemical([facility])  . MM DIGITAL SCREENING BILATERAL    Standing Status:   Future    Standing Expiration Date:   08/08/2018    Order Specific Question:   Reason for Exam (SYMPTOM  OR DIAGNOSIS REQUIRED)    Answer:   screening for breast cancer    Order Specific Question:   Preferred imaging location?    Answer:   [facility]   No orders of the defined types were placed in this encounter.   Rory Percy, DO PGY-1, [facility] 08/07/2017 4:55 PM

## 2017-08-10 LAB — CYTOLOGY - PAP
Diagnosis: NEGATIVE
HPV (WINDOPATH): NOT DETECTED

## 2017-08-13 ENCOUNTER — Encounter: Payer: Self-pay | Admitting: *Deleted

## 2017-08-23 NOTE — Addendum Note (Signed)
Addended by: [place] Pink on: 08/23/2017 10:40 AM   Modules accepted: Orders

## 2017-08-25 LAB — FECAL OCCULT BLOOD, IMMUNOCHEMICAL: Fecal Occult Bld: NEGATIVE

## 2017-08-27 ENCOUNTER — Encounter: Payer: Self-pay | Admitting: *Deleted

## 2017-08-30 ENCOUNTER — Ambulatory Visit
Admission: RE | Admit: 2017-08-30 | Discharge: 2017-08-30 | Disposition: A | Discharge status: Home / Self Care | Payer: BC Managed Care – PPO | Source: Ambulatory Visit | Attending: Family Medicine | Admitting: Family Medicine

## 2017-08-30 DIAGNOSIS — Z1239 Encounter for other screening for malignant neoplasm of breast: Secondary | ICD-10-CM

## 2018-08-08 ENCOUNTER — Other Ambulatory Visit: Payer: Self-pay | Admitting: Family Medicine

## 2018-08-08 DIAGNOSIS — Z1231 Encounter for screening mammogram for malignant neoplasm of breast: Secondary | ICD-10-CM

## 2018-10-03 ENCOUNTER — Ambulatory Visit: Payer: BC Managed Care – PPO

## 2019-06-12 ENCOUNTER — Other Ambulatory Visit: Payer: Self-pay

## 2019-06-12 ENCOUNTER — Encounter: Payer: Self-pay | Admitting: Family Medicine

## 2019-06-12 ENCOUNTER — Ambulatory Visit (INDEPENDENT_AMBULATORY_CARE_PROVIDER_SITE_OTHER): Payer: BC Managed Care – PPO | Admitting: Family Medicine

## 2019-06-12 VITALS — BP 124/76 | HR 87 | Ht 65.0 in | Wt 168.4 lb

## 2019-06-12 DIAGNOSIS — M6740 Ganglion, unspecified site: Secondary | ICD-10-CM

## 2019-06-12 DIAGNOSIS — R7989 Other specified abnormal findings of blood chemistry: Secondary | ICD-10-CM | POA: Diagnosis not present

## 2019-06-12 DIAGNOSIS — Z1211 Encounter for screening for malignant neoplasm of colon: Secondary | ICD-10-CM

## 2019-06-12 DIAGNOSIS — Z1231 Encounter for screening mammogram for malignant neoplasm of breast: Secondary | ICD-10-CM

## 2019-06-12 DIAGNOSIS — Z9109 Other allergy status, other than to drugs and biological substances: Secondary | ICD-10-CM

## 2019-06-12 DIAGNOSIS — E2839 Other primary ovarian failure: Secondary | ICD-10-CM | POA: Diagnosis not present

## 2019-06-12 DIAGNOSIS — Z0000 Encounter for general adult medical examination without abnormal findings: Secondary | ICD-10-CM

## 2019-06-12 MED ORDER — LORATADINE 10 MG PO TABS: 10.0000 mg | ORAL_TABLET | Freq: Every day | ORAL | 11 refills | Status: AC

## 2019-06-12 NOTE — Progress Notes (Signed)
    SUBJECTIVE:   Hand cyst - noticed top cyst on the top part of her wrist a few months ago - only really bothers with moving wrist a lot. Does some farming. - massaging helps - noticed when working with hands a lot - bothers when moving - not taken anything for pain. - may have been hit, no known trauma - not drained - no fevers, swelling, redness  Sinus congestion - noticed head and nasal congestion >1 year  - no purulent nasal discharge, occasional runny nose - took claritin with some relief.  - no fevers, sob. No current cough. - some ear pain with a cold this past week.  - no sick or covid contacts. - symptoms are similar to her prior allergy symptoms, has not been taking medications for the past year.  Healthcare Maintenance - Vaccines: flu, declined - Colonoscopy: FOBT negative 08/2017 - Mammogram: 08/2017 negative - Pap Smear: UTD - DEXA Scan: due  PERTINENT  PMH / PSH: hip OA, uterine fibroids  OBJECTIVE:   BP 124/76   Pulse 87   Ht 5\' 5"  (1.651 m)   Wt 168 lb 6 oz (76.4 kg)   SpO2 99%   BMI 28.02 kg/m   General: well nourished, well developed, in no acute distress with non-toxic appearance HEENT: normocephalic, atraumatic, moist mucous membranes. TMs visible bilaterally without erythema, bulging, purulence.  Oropharynx clear without erythema, visible drainage, mucosal lesions, tonsil enlargement.  Upper dentures in place.  Nontender to palpation over maxillary or frontal sinuses. Neck: supple, non-tender without lymphadenopathy CV: regular rate and rhythm without murmurs, rubs, or gallops, no lower extremity edema Lungs: clear to auscultation bilaterally with normal work of breathing Skin: warm, dry, no rashes or lesions. ~1.5cm fluid-filled structure with well defined borders to dorsum of proximal R hand overlying carpal-radial joint without surrounding erythema, nonTTP. MSK: full ROM with wrist extension/flexion. 5/5 strength with resisted wrist  flexion/extension.  ASSESSMENT/PLAN:   Ganglion cyst Visualized on exam. As not particularly bothersome to patient, can continue with conservative management with compression/massage and NSAIDs as needed for pain.  If enlarges or becomes painful or bothersome enough to the patient, can consider surgical referral for removal at that time.  Environmental allergies History and exam consistent with environmental allergies, especially as patient works a lot outside.  Recommended Claritin for relief given she has previously had good effect with this.  No symptoms or exam findings consistent for infection, space-occupying lesion.  Return precautions discussed, see AVS for details.  Healthcare maintenance Orders placed for updated mammogram and DEXA scan.  We will also obtain updated FOBT, patient declines colonoscopy.  UTD with Pap smear.  Declined flu vaccine.   Rory Percy, [place]

## 2019-06-12 NOTE — Patient Instructions (Signed)
It was great to see you!  Our plans for today:  - Take a Claritin every day for your allergies.  You can also take some decongestant to help with congestion.  If you develop fevers, cough, difficulties breathing, let us know. - We ordered a mammogram and bone density scan today, someone will call you with these appointments. - Let us know if your hand starts to bother you more and we can refer you for surgery to get the cyst removed. - Use the stool kit as instructed for your colon cancer screening.  We are checking some labs today, we will call you or send you a letter if they are abnormal.   Take care and seek immediate care sooner if you develop any concerns.   Dr. [facility]

## 2019-06-13 DIAGNOSIS — M6740 Ganglion, unspecified site: Secondary | ICD-10-CM | POA: Insufficient documentation

## 2019-06-13 DIAGNOSIS — Z9109 Other allergy status, other than to drugs and biological substances: Secondary | ICD-10-CM | POA: Insufficient documentation

## 2019-06-13 LAB — BASIC METABOLIC PANEL
BUN/Creatinine Ratio: 21 (ref 12–28)
BUN: 20 mg/dL (ref 8–27)
CO2: 23 mmol/L (ref 20–29)
Calcium: 9.6 mg/dL (ref 8.7–10.3)
Chloride: 107 mmol/L — ABNORMAL HIGH (ref 96–106)
Creatinine, Ser: 0.95 mg/dL (ref 0.57–1.00)
GFR calc Af Amer: 74 mL/min/{1.73_m2} (ref >59)
GFR calc non Af Amer: 64 mL/min/{1.73_m2} (ref >59)
Glucose: 87 mg/dL (ref 65–99)
Potassium: 4.3 mmol/L (ref 3.5–5.2)
Sodium: 145 mmol/L — ABNORMAL HIGH (ref 134–144)

## 2019-06-13 NOTE — Assessment & Plan Note (Signed)
Orders placed for updated mammogram and DEXA scan.  We will also obtain updated FOBT, patient declines colonoscopy.  UTD with Pap smear.  Declined flu vaccine.

## 2019-06-13 NOTE — Assessment & Plan Note (Signed)
History and exam consistent with environmental allergies, especially as patient works a lot outside.  Recommended Claritin for relief given she has previously had good effect with this.  No symptoms or exam findings consistent for infection, space-occupying lesion.  Return precautions discussed, see AVS for details.

## 2019-06-13 NOTE — Assessment & Plan Note (Addendum)
Visualized on exam. As not particularly bothersome to patient, can continue with conservative management with compression/massage and NSAIDs as needed for pain.  If enlarges or becomes painful or bothersome enough to the patient, can consider surgical referral for removal at that time.

## 2019-06-20 LAB — FECAL OCCULT BLOOD, IMMUNOCHEMICAL: Fecal Occult Bld: NEGATIVE

## 2019-10-01 ENCOUNTER — Other Ambulatory Visit: Payer: BC Managed Care – PPO

## 2019-10-01 ENCOUNTER — Ambulatory Visit: Payer: BC Managed Care – PPO

## 2019-12-19 ENCOUNTER — Other Ambulatory Visit: Payer: Self-pay

## 2019-12-19 ENCOUNTER — Ambulatory Visit
Admission: RE | Admit: 2019-12-19 | Discharge: 2019-12-19 | Disposition: A | Discharge status: Home / Self Care | Payer: BLUE CROSS/BLUE SHIELD | Source: Ambulatory Visit | Attending: Family Medicine | Admitting: Family Medicine

## 2019-12-19 DIAGNOSIS — Z1231 Encounter for screening mammogram for malignant neoplasm of breast: Secondary | ICD-10-CM

## 2019-12-19 DIAGNOSIS — E2839 Other primary ovarian failure: Secondary | ICD-10-CM

## 2020-09-11 IMAGING — MG DIGITAL SCREENING BILAT W/ CAD
5 series · 5 of 5 positions shown · non-contrast
Comparison: Previous exam(s).

CLINICAL DATA: Screening.

EXAM:
DIGITAL SCREENING BILATERAL MAMMOGRAM WITH CAD

[R MLO]
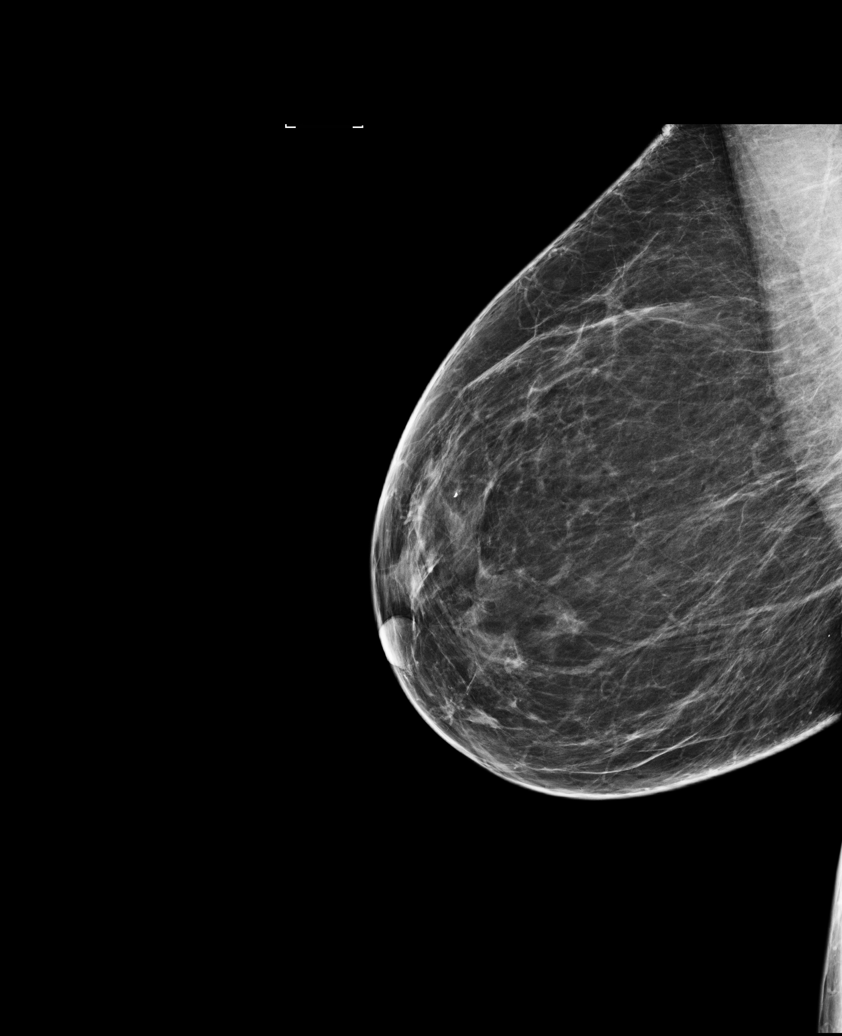

[L MLO (1 of 2)]
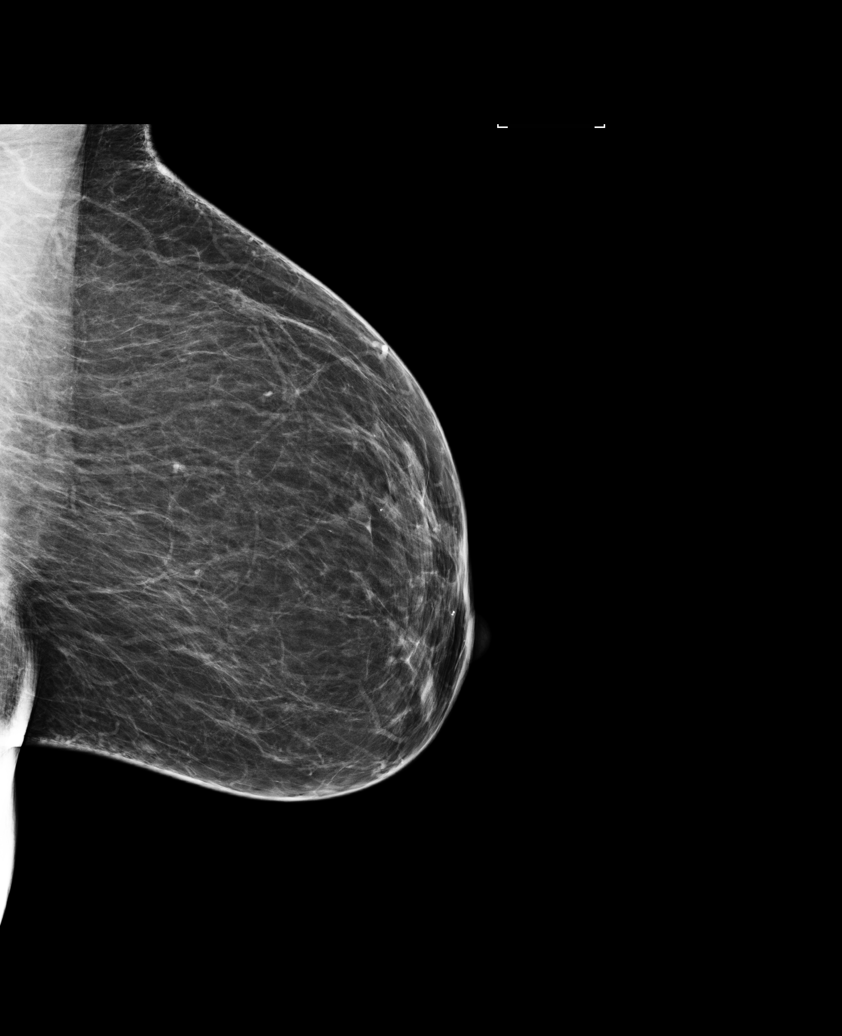

[L MLO (2 of 2)]
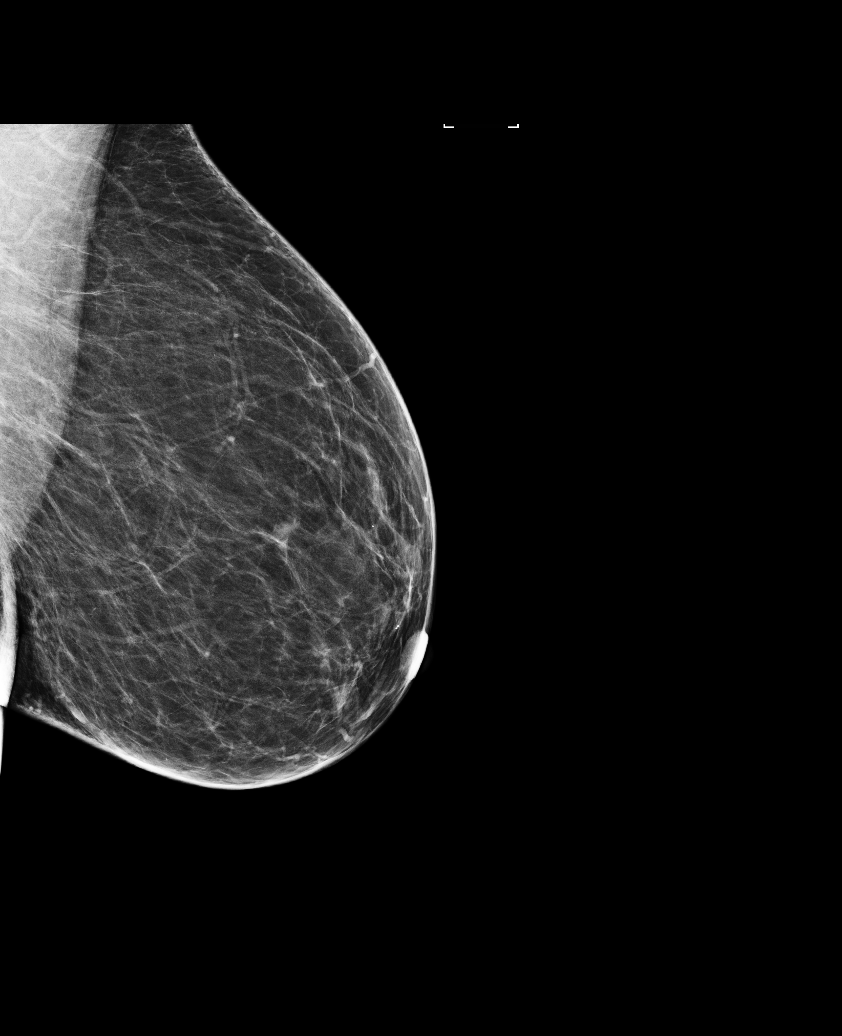

[L CC]
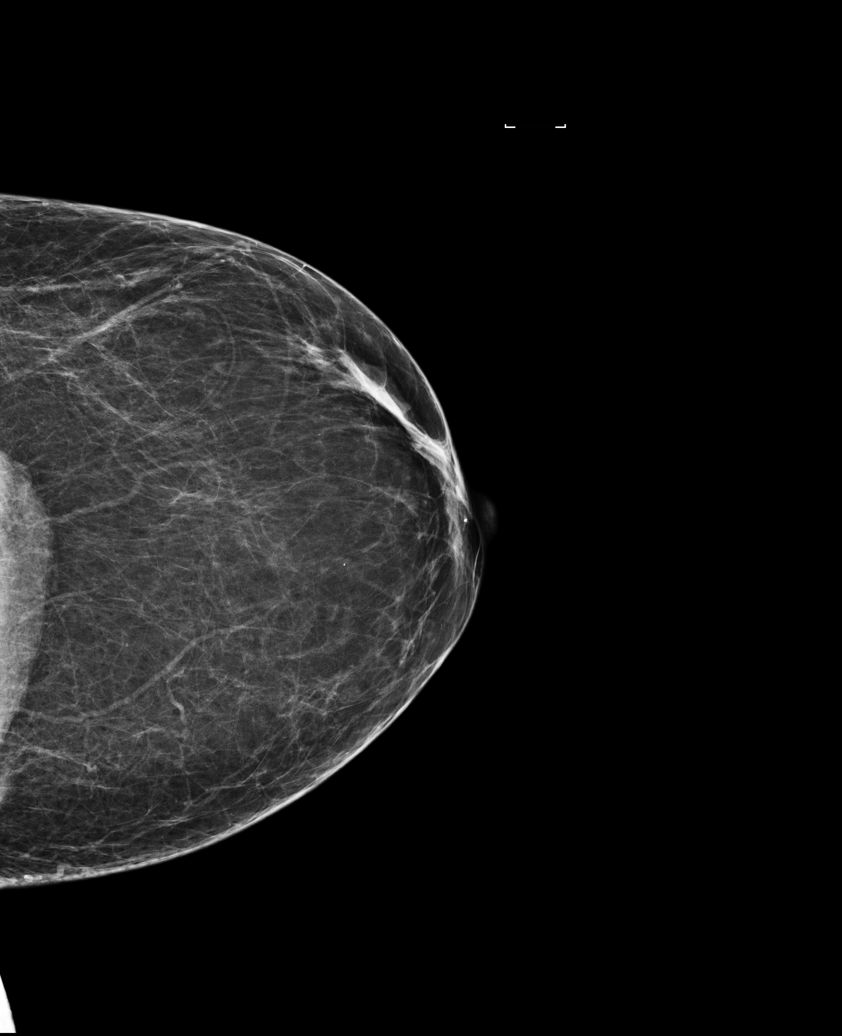

[R CC]
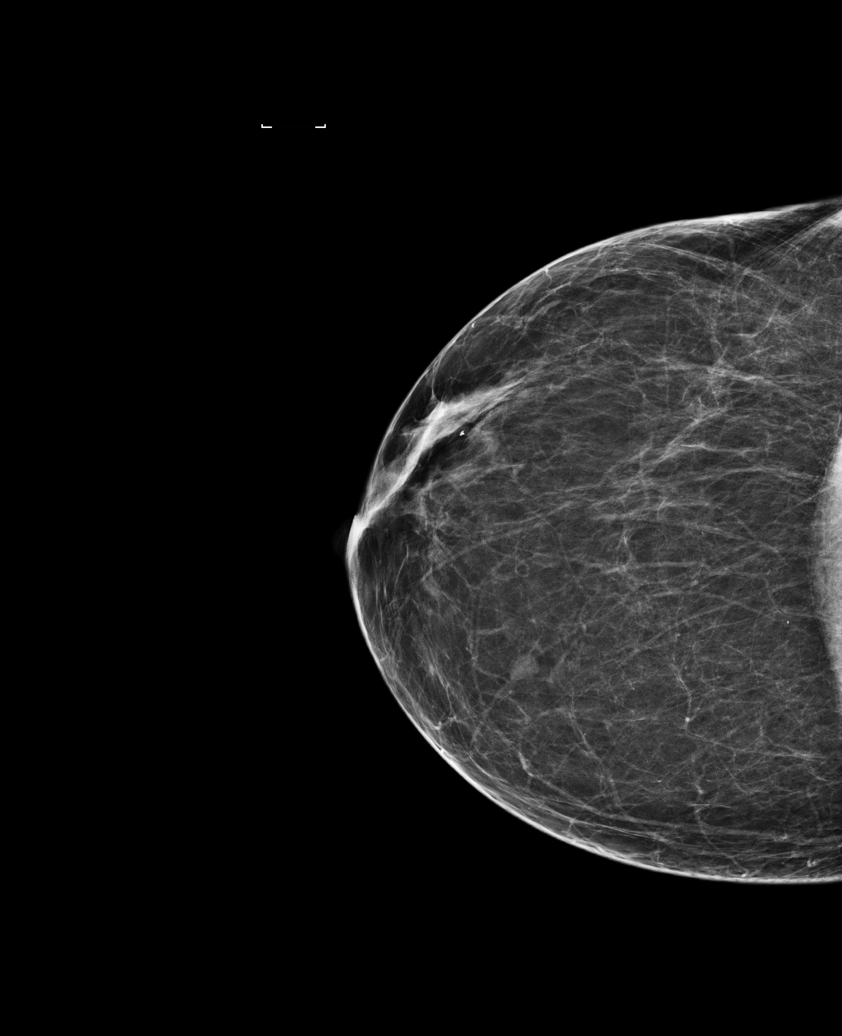

[5 of 5 positions shown; findings below may reference images not displayed]

ACR Breast Density Category b: There are scattered areas of
fibroglandular density.
FINDINGS: There are no findings suspicious for malignancy. Images were
processed with CAD.
IMPRESSION: No mammographic evidence of malignancy. A result letter of this
screening mammogram will be mailed directly to the patient.

RECOMMENDATION:
Screening mammogram in one year. (Code:AS-G-LCT)

BI-RADS CATEGORY  1: Negative.

## 2021-03-16 ENCOUNTER — Ambulatory Visit: Payer: Self-pay | Admitting: Family Medicine

## 2022-02-06 LAB — COLOGUARD: COLOGUARD: NEGATIVE

## 2023-12-22 ENCOUNTER — Encounter: Payer: Self-pay | Admitting: *Deleted
